# Patient Record
Sex: Male | Born: 1995 | Race: White | Hispanic: No | Marital: Single | Smoking: Never smoker
Health system: Southern US, Community
[De-identification: ages and names within clinical notes are randomized; demographics above are authoritative.]

## PROBLEM LIST (undated history)

## (undated) HISTORY — PX: OTHER SURGICAL HISTORY: SHX169

## (undated) HISTORY — PX: APPENDECTOMY: SHX54

---
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
## 2017-11-26 HISTORY — PX: PECTUS EXCAVATUM REPAIR: SHX437

## 2018-09-16 ENCOUNTER — Other Ambulatory Visit: Payer: Self-pay | Admitting: Cardiothoracic Surgery

## 2018-09-16 ENCOUNTER — Other Ambulatory Visit: Payer: Self-pay

## 2018-09-16 DIAGNOSIS — Q676 Pectus excavatum: Secondary | ICD-10-CM

## 2018-09-17 ENCOUNTER — Encounter: Payer: Self-pay | Admitting: *Deleted

## 2018-09-17 ENCOUNTER — Other Ambulatory Visit: Payer: Self-pay | Admitting: *Deleted

## 2018-09-17 ENCOUNTER — Encounter: Payer: Self-pay | Admitting: Cardiothoracic Surgery

## 2018-09-17 ENCOUNTER — Ambulatory Visit
Admission: RE | Admit: 2018-09-17 | Discharge: 2018-09-17 | Disposition: A | Discharge status: Home / Self Care | Payer: 59 | Source: Ambulatory Visit | Attending: Cardiothoracic Surgery | Admitting: Cardiothoracic Surgery

## 2018-09-17 ENCOUNTER — Institutional Professional Consult (permissible substitution) (INDEPENDENT_AMBULATORY_CARE_PROVIDER_SITE_OTHER): Payer: 59 | Admitting: Cardiothoracic Surgery

## 2018-09-17 VITALS — BP 119/77 | HR 81 | Temp 99.0°F | Resp 16 | Ht 70.0 in | Wt 163.0 lb

## 2018-09-17 DIAGNOSIS — Q676 Pectus excavatum: Secondary | ICD-10-CM

## 2018-09-22 ENCOUNTER — Encounter: Payer: Self-pay | Admitting: Cardiothoracic Surgery

## 2018-09-22 NOTE — Progress Notes (Signed)
[address]       [place] [id number]             [phone]                    Darren KassPatrick T Hollenback [facility] Medical Record #[id number] Date of Birth: 03/04/1996  Referring: self Primary Care: Patient, No Pcp Per Primary Cardiologist: No primary care provider on file.  Chief Complaint:    Chief Complaint  Patient presents with  . Routine Post Op    to assess previous REPAIR  of PECTUS EX[place]UM [place]    History of Present Illness:    Darren Phillips 23 y.o. male is seen in the office  today at the request of the patient and his father.  The patient is a 23 year old male gives a history of a open pectus repair modified Ravitch type on November 26, 2017 at [facility] in [place] on Dr. Marca Ancona[place]more.  The patient notes that he developed a wound infection particularly along the left lower side of the repair.  He notes he did local dressing changes for 5 months before it finally healed.  He denies any current fever or chills the wound has remained intact without any erythema or drainage.  The patient complains of chronic pain along the incision.  He is also concerned that his heart is being squeezed.    Current Activity/ Functional Status:  Patient is independent with mobility/ambulation, transfers, ADL's, IADL's.   Zubrod Score: At the time of surgery this patient's most appropriate activity status/level should be described as: []     0    Normal activity, no symptoms []     1    Restricted in physical strenuous activity but ambulatory, able to do out light work []     2    Ambulatory and capable of self care, unable to do work activities, up and about               >50 % of waking hours                              []     3    Only limited self care, in bed greater than 50% of waking hours []     4    Completely disabled, no self care, confined to bed or chair []     5    Moribund   History reviewed. No pertinent past medical  history.  Past Surgical History:  Procedure Laterality Date  . APPENDECTOMY    . PECTUS EXCAVATUM REPAIR  11/26/2017   Dr. Hoyle Barrennis [place]...  Marland Kitchen. post op wound     s/p REPAIR OF PECTUS EXCAVATUM   11/08/17    History reviewed. No pertinent family history.   Social History   Tobacco Use  Smoking Status Never Smoker  Smokeless Tobacco Never Used    Social History   Substance and Sexual Activity  Alcohol Use Not on file   Comment: occasionally       No current outpatient medications on file.   No current facility-administered medications for this visit.     Pertinent items are noted in HPI.   Review of Systems:     Cardiac Review of Systems: [Y] = yes  or   [ N ] = no   Chest Pain [ y   ]  Resting SOB [   ]  Exertional SOB  [ y ]  Myer Peer  ]   Pedal Edema [ n ]    Palpitations [ n ] Syncope  [n  ]   Presyncope [ dizzyness  ]   General Review of Systems: [Y] = yes [  ]=no Constitional: recent weight change [  ];  Wt loss over the last 3 months [   ] anorexia [  ]; fatigue [  ]; nausea [  ]; night sweats [  ]; fever [  ]; or chills [  ];           Eye : blurred vision [  ]; diplopia [   ]; vision changes [  ];  Amaurosis fugax[  ]; Resp: cough [  ];  wheezing[  ];  hemoptysis[  ]; shortness of breath[  ]; paroxysmal nocturnal dyspnea[  ]; dyspnea on exertion[  ]; or orthopnea[  ];  GI:  gallstones[  ], vomiting[  ];  dysphagia[  ]; melena[  ];  hematochezia [  ]; heartburn[  ];   Hx of  Colonoscopy[  ]; GU: kidney stones [  ]; hematuria[  ];   dysuria [  ];  nocturia[  ];  history of     obstruction [  ]; urinary frequency [  ]             Skin: rash, swelling[  ];, hair loss[  ];  peripheral edema[  ];  or itching[  ]; Musculosketetal: myalgias[  ];  joint swelling[  ];  joint erythema[  ];  joint pain[  ];  back pain[  ];  Heme/Lymph: bruising[  ];  bleeding[  ];  anemia[  ];  Neuro: TIA[  ];  headaches[  ];  stroke[  ];  vertigo[  ];  seizures[  ];   paresthesias[   ];  difficulty walking[  ];  Psych:depression[  ]; anxiety[  ];  Endocrine: diabetes[  ];  thyroid dysfunction[  ];  Immunizations: Flu up to date [  ]; Pneumococcal up to date [  ];  Other:     PHYSICAL EXAMINATION: BP 119/77 (BP Location: Right Arm, Patient Position: Sitting, Cuff Size: Normal)   Pulse 81   Temp 99 F (37.2 C) (Oral)   Resp 16   Ht 5\' 10"  (1.778 m)   Wt 163 lb (73.9 kg)   SpO2 97%   BMI 23.39 kg/m  General appearance: alert, cooperative and appears stated age Head: Normocephalic, without obvious abnormality, atraumatic Neck: no adenopathy, no carotid bruit, no JVD, supple, symmetrical, trachea midline and thyroid not enlarged, symmetric, no tenderness/mass/nodules Resp: clear to auscultation bilaterally Cardio: regular rate and rhythm, S1, S2 normal, no murmur, click, rub or gallop GI: soft, non-tender; bowel sounds normal; no masses,  no organomegaly Extremities: extremities normal, atraumatic, no cyanosis or edema Neurologic: Grossly normal Incision: Incision is healed without evidence of drainage, the left side the incision is milder the patient notes this is the area that had broken down and that he packed previously.  The degree of deformity is mild, when measured there is approximately 1.5 cm depression in the center compared to the high points on each side.        Diagnostic Studies & Laboratory data:     Recent Radiology Findings:   Dg Chest 2 View  Result Date: 09/17/2018 CLINICAL DATA:  Chest pain, shortness of breath, pectus deformity EXAM: CHEST - 2 VIEW COMPARISON:  None. FINDINGS: Anterior plate screw  fixation of the midline sternum. Residual pectus deformity on the lateral view. Normal heart size and vascularity. Lungs remain clear. No effusion or pneumothorax. Trachea midline. No acute osseous finding. IMPRESSION: Postop changes as above.  No acute finding by plain radiography Electronically Signed   By: Judie Petit.  Shick M.D.   On: 09/17/2018 15:13      I have independently reviewed the above radiology studies  and reviewed the findings with the patient.   Recent Lab Findings: No results found for: WBC, HGB, HCT, PLT, GLUCOSE, CHOL, TRIG, HDL, LDLDIRECT, LDLCALC, ALT, AST, NA, K, CL, CREATININE, BUN, CO2, TSH, INR, GLUF, HGBA1C    Assessment / Plan:    1 postop modified Ravitch repair of pectus previous Haller index 3.0, complicated by postoperative wound infection healed by packing and secondary granulation over a 85-month.  Now with chronic pain in the area.  On chest x-ray there is no evidence of displaced rib plate or screws.  I discussed with the patient and his father(who was on the phone during the visit) proceeding with CT scan of the chest to evaluate the repair and also to look for any evidence of possible osteomyelitis in the bone.  I will plan to see the patient back after the CT scan has been completed.     I  spent 30 minutes with  the patient face to face and greater then 50% of the time was spent in counseling and coordination of care.    Delight Ovens MD      [address] [place] [place] [address] Office [phone]   Beeper [phone]  09/22/2018 11:55 AM

## 2018-09-25 ENCOUNTER — Ambulatory Visit
Admission: RE | Admit: 2018-09-25 | Discharge: 2018-09-25 | Disposition: A | Discharge status: Home / Self Care | Payer: 59 | Source: Ambulatory Visit | Attending: Cardiothoracic Surgery | Admitting: Cardiothoracic Surgery

## 2018-09-25 DIAGNOSIS — Q676 Pectus excavatum: Secondary | ICD-10-CM

## 2018-10-01 ENCOUNTER — Other Ambulatory Visit: Payer: Self-pay

## 2018-10-01 ENCOUNTER — Ambulatory Visit (INDEPENDENT_AMBULATORY_CARE_PROVIDER_SITE_OTHER): Payer: 59 | Admitting: Cardiothoracic Surgery

## 2018-10-01 ENCOUNTER — Encounter: Payer: Self-pay | Admitting: Cardiothoracic Surgery

## 2018-10-01 VITALS — BP 127/82 | HR 102 | Temp 97.9°F | Resp 16 | Ht 70.0 in | Wt 160.0 lb

## 2018-10-01 DIAGNOSIS — Q676 Pectus excavatum: Secondary | ICD-10-CM

## 2018-10-01 NOTE — Progress Notes (Signed)
[address]       [place] [address]             [phone]                    Julienne Kass [facility] Medical Record #[id number] Date of Birth: Oct 22, 1995  Referring: self Primary Care: Patient, No Pcp Per Primary Cardiologist: No primary care provider on file.  Chief Complaint:    Chief Complaint  Patient presents with   Follow-up    f/u after Chest CT 09/25/18, HX of open pectus repair  11/26/17 @ [facility] , [place]    History of Present Illness:    Darren Phillips 23 y.o. male is seen in the office last week at the request of the patient and his father.  The patient is a 23 year old male gives a history of a open pectus repair modified Ravitch type on November 26, 2017 at [facility] in [place] on Dr. Marca Ancona.  The patient notes that he developed a wound infection particularly along the left lower side of the repair.  He notes he did local dressing changes for 5 months before it finally healed.  He denies any current fever or chills the wound has remained intact without any erythema or drainage.  The patient complains of chronic pain along the incision.  He is also concerned that his heart is being squeezed.   HE RETURNS TODAY WITH FOLLOW CT SCAN TO EVALUATE REPAIR AND LOOK FOR ANY EVIDENCE OF OSTEO   Current Activity/ Functional Status:  Patient is independent with mobility/ambulation, transfers, ADL's, IADL's.   Zubrod Score: At the time of surgery this patients most appropriate activity status/level should be described as: []     0    Normal activity, no symptoms [x]     1    Restricted in physical strenuous activity but ambulatory, able to do out light work []     2    Ambulatory and capable of self care, unable to do work activities, up and about               >50 % of waking hours                              []     3    Only limited self care, in bed greater than 50% of waking hours []     4    Completely  disabled, no self care, confined to bed or chair []     5    Moribund   No past medical history on file.  Past Surgical History:  Procedure Laterality Date   APPENDECTOMY     PECTUS EXCAVATUM REPAIR  11/26/2017   Dr. Hoyle Barr...   post op wound     s/p REPAIR OF PECTUS EXCAVATUM   11/08/17    No family history on file.   Social History   Tobacco Use  Smoking Status Never Smoker  Smokeless Tobacco Never Used    Social History   Substance and Sexual Activity  Alcohol Use Not on file   Comment: occasionally       No current outpatient medications on file.   No current facility-administered medications for this visit.     Pertinent items are noted in HPI.   Review of Systems:     Cardiac Review of Systems: [Y] = yes  or   [  N ] = no   Chest Pain [ y   ]  Resting SOB [   ] Exertional SOB  [ y ]  Orthopnea Milo.Brash[n  ]   Pedal Edema [ n ]    Palpitations [ n ] Syncope  [n  ]   Presyncope [ dizzyness  ]   General Review of Systems: [Y] = yes [  ]=no Constitional: recent weight change [  ];  Wt loss over the last 3 months [   ] anorexia [  ]; fatigue [  ]; nausea [  ]; night sweats [  ]; fever [  ]; or chills [  ];           Eye : blurred vision [  ]; diplopia [   ]; vision changes [  ];  Amaurosis fugax[  ]; Resp: cough [  ];  wheezing[  ];  hemoptysis[  ]; shortness of breath[  ]; paroxysmal nocturnal dyspnea[  ]; dyspnea on exertion[  ]; or orthopnea[  ];  GI:  gallstones[  ], vomiting[  ];  dysphagia[  ]; melena[  ];  hematochezia [  ]; heartburn[  ];   Hx of  Colonoscopy[  ]; GU: kidney stones [  ]; hematuria[  ];   dysuria [  ];  nocturia[  ];  history of     obstruction [  ]; urinary frequency [  ]             Skin: rash, swelling[  ];, hair loss[  ];  peripheral edema[  ];  or itching[  ]; Musculosketetal: myalgias[  ];  joint swelling[  ];  joint erythema[  ];  joint pain[  ];  back pain[  ];  Heme/Lymph: bruising[  ];  bleeding[  ];  anemia[  ];  Neuro: TIA[   ];  headaches[  ];  stroke[  ];  vertigo[  ];  seizures[  ];   paresthesias[  ];  difficulty walking[  ];  Psych:depression[  ]; anxiety[  ];  Endocrine: diabetes[  ];  thyroid dysfunction[  ];  Immunizations: Flu up to date [  ]; Pneumococcal up to date [  ];  Other:     PHYSICAL EXAMINATION: BP 127/82 (BP Location: Right Arm, Patient Position: Sitting, Cuff Size: Normal)    Pulse (!) 102    Temp 97.9 F (36.6 C) (Skin)    Resp 16    Ht 5\' 10"  (1.778 m)    Wt 160 lb (72.6 kg)    SpO2 98% Comment: RA   BMI 22.96 kg/m  General appearance: alert and cooperative Head: Normocephalic, without obvious abnormality, atraumatic Resp: clear to auscultation bilaterally Cardio: regular rate and rhythm, S1, S2 normal, no murmur, click, rub or gallop GI: soft, non-tender; bowel sounds normal; no masses,  no organomegaly Extremities: extremities normal, atraumatic, no cyanosis or edema and Homans sign is negative, no sign of DVT Neurologic: Grossly normal Incision: Incision is healed without evidence of drainage, the left side the incision is milder the patient notes this is the area that had broken down and that he packed previously.  The degree of deformity is mild, when measured there is approximately 1.5 cm depression in the center compared to the high points on each side.        Diagnostic Studies & Laboratory data:     Recent Radiology Findings:   Dg Chest 2 View  Result Date: 09/17/2018 CLINICAL DATA:  Chest pain, shortness  of breath, pectus deformity EXAM: CHEST - 2 VIEW COMPARISON:  None. FINDINGS: Anterior plate screw fixation of the midline sternum. Residual pectus deformity on the lateral view. Normal heart size and vascularity. Lungs remain clear. No effusion or pneumothorax. Trachea midline. No acute osseous finding. IMPRESSION: Postop changes as above.  No acute finding by plain radiography Electronically Signed   By: Judie Petit.  Shick M.D.   On: 09/17/2018 15:13   Ct Chest Wo  Contrast  Result Date: 09/25/2018 CLINICAL DATA:  History of pectus excavatum repair in July 2019 with subsequent wound infection at incision. Pain at incision. EXAM: CT CHEST WITHOUT CONTRAST TECHNIQUE: Multidetector CT imaging of the chest was performed following the standard protocol without IV contrast. COMPARISON:  Chest radiographs 09/17/2018. FINDINGS: Cardiovascular: No significant vascular findings on noncontrast imaging. The heart size is normal. There is no pericardial effusion. Mediastinum/Nodes: There are no enlarged mediastinal, hilar or axillary lymph nodes.A small amount of residual thymic tissue is present in the anterior mediastinum. The thyroid gland, trachea and esophagus demonstrate no significant findings. Lungs/Pleura: There is no pleural effusion or pneumothorax. The lungs are clear. Upper abdomen:  The visualized upper abdomen appears unremarkable. Musculoskeletal/Chest wall: There are postsurgical changes in the sternal body with an anterior plate and screws. There is no hardware displacement or loosening. The sternomanubrial junction and sternoclavicular joints appear normal. There is a persistent moderate pectus excavatum deformity with resulting mild mass effect on the heart. There is no parasternal inflammatory change or fluid collection. There is mild linear dermal/subcutaneous thickening along the presumed incision which could reflect keloid formation. Mild bilateral gynecomastia noted. IMPRESSION: 1. No demonstrated complication from previous repair of pectus excavatum deformity. There is no hardware displacement. 2. Apparent linear dermal/subcutaneous thickening along the incision. No focal fluid collection. 3. Residual pectus excavatum deformity with mild mass effect on the heart. The mediastinum and lungs otherwise appear unremarkable. Electronically Signed   By: Carey Bullocks M.D.   On: 09/25/2018 17:07   Although not reported my estimate of the Haller index is 2.8   I  have independently reviewed the above radiology studies  and reviewed the findings with the patient.   Recent Lab Findings: No results found for: WBC, HGB, HCT, PLT, GLUCOSE, CHOL, TRIG, HDL, LDLDIRECT, LDLCALC, ALT, AST, NA, K, CL, CREATININE, BUN, CO2, TSH, INR, GLUF, HGBA1C    Assessment / Plan:    1 postop modified Ravitch repair of pectus previous Haller index 3.0, complicated by postoperative wound infection healed by packing and secondary granulation over a 61-month.  Now with chronic pain in the area.  On chest x-ray there is no evidence of displaced rib plate or screws.  I discussed with the patient and his father(who was on the phone during the visit) proceeding with CT scan of the chest to evaluate the repair and also to look for any evidence of possible osteomyelitis in the bone.  CT scan shows no changes in the hardware or evidence of osteomyelitis that would cause postoperative pain.  I have discussed urged any use of narcotics, and made it clear to the patient I would not be prescribing narcotics.  I did offer the referral to pain clinic, the patient will think about this and let us know.  At This point I see no indication for surgical intervention.      Delight Ovens MD      [address] [place] [place] [address] Office [phone]   Beeper [phone]  10/01/2018 10:43 AM

## 2020-04-20 IMAGING — CR CHEST - 2 VIEW
2 series · 2 of 2 positions shown · non-contrast
Comparison: None.

CLINICAL DATA: Chest pain, shortness of breath, pectus deformity

EXAM:
CHEST - 2 VIEW

[w chest pa]
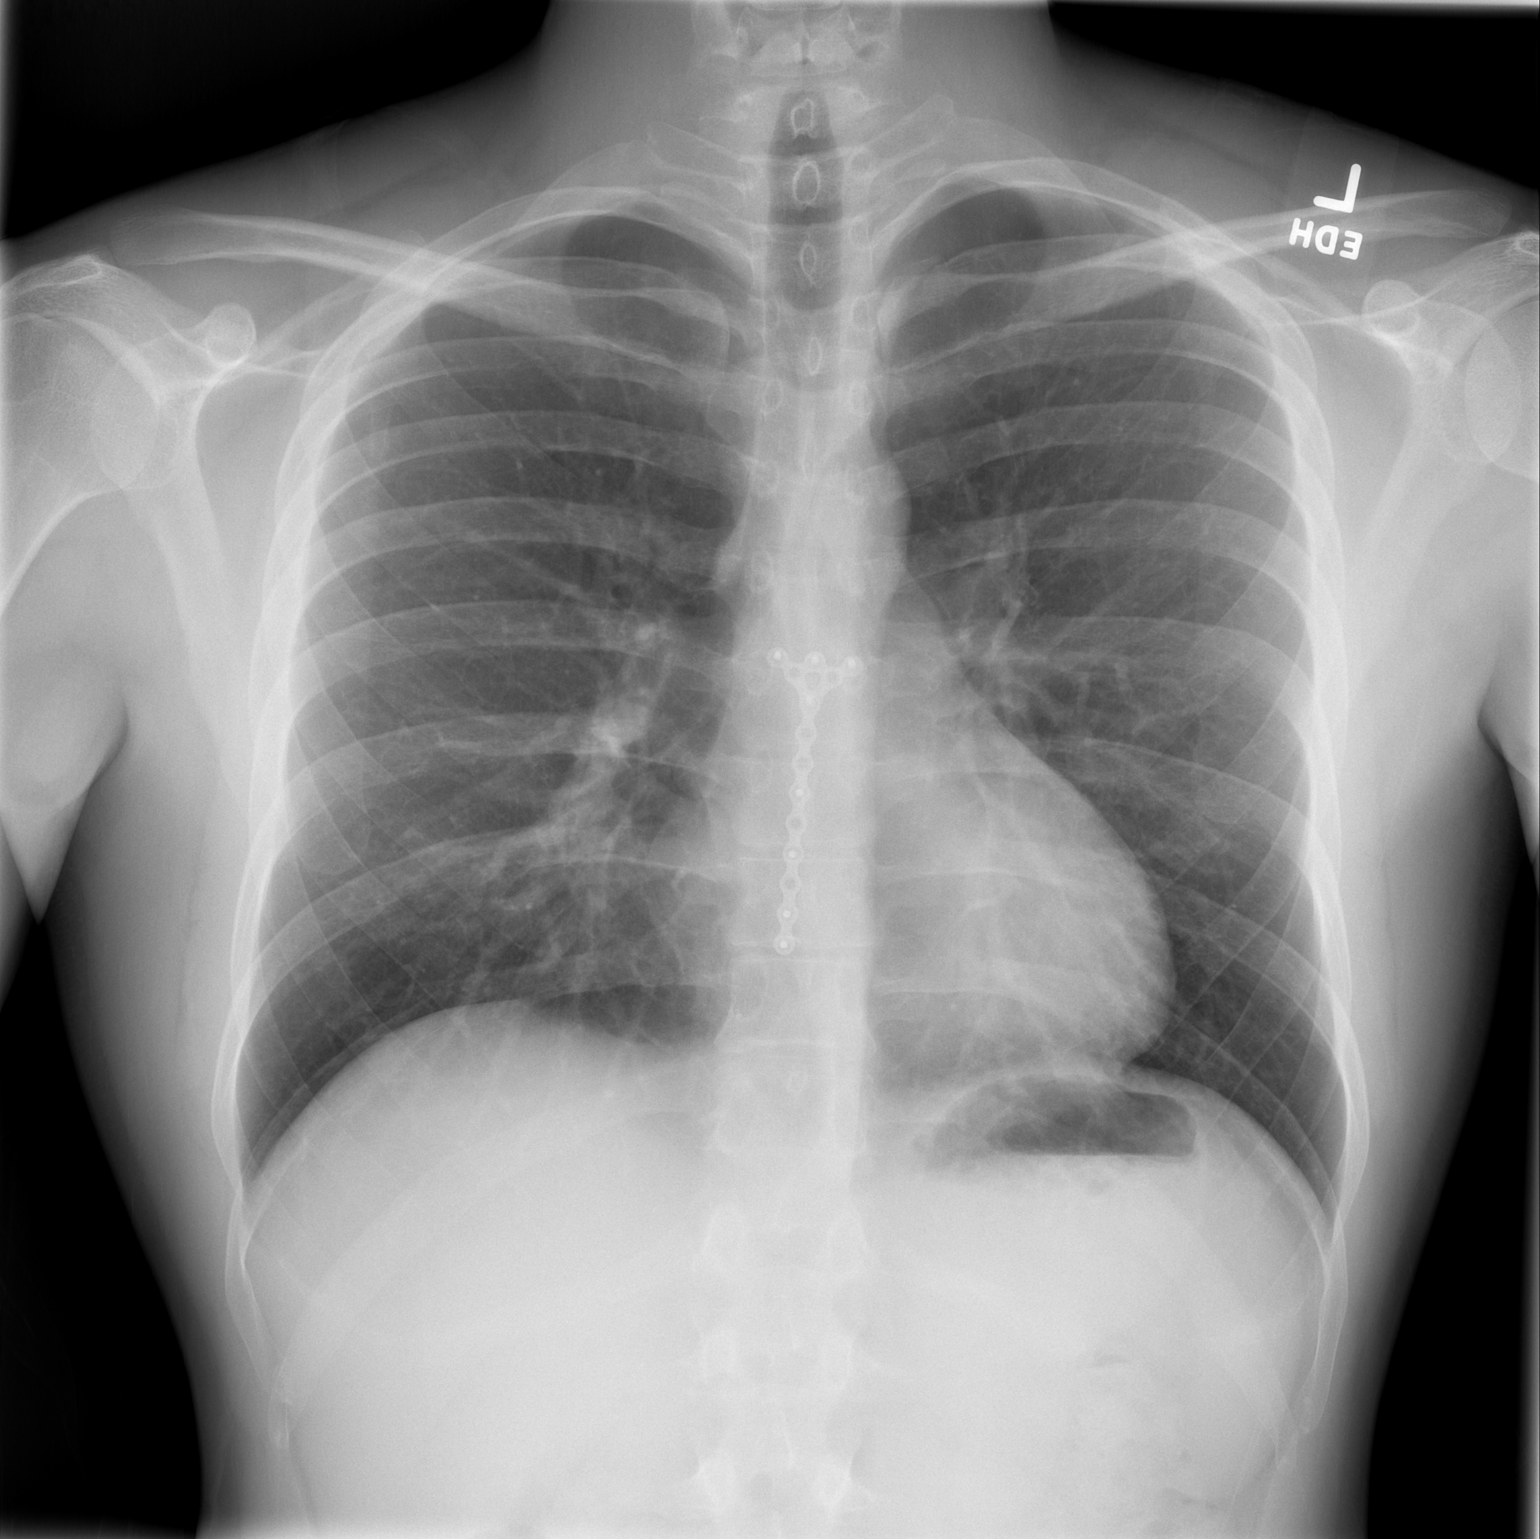

[w chest lat]
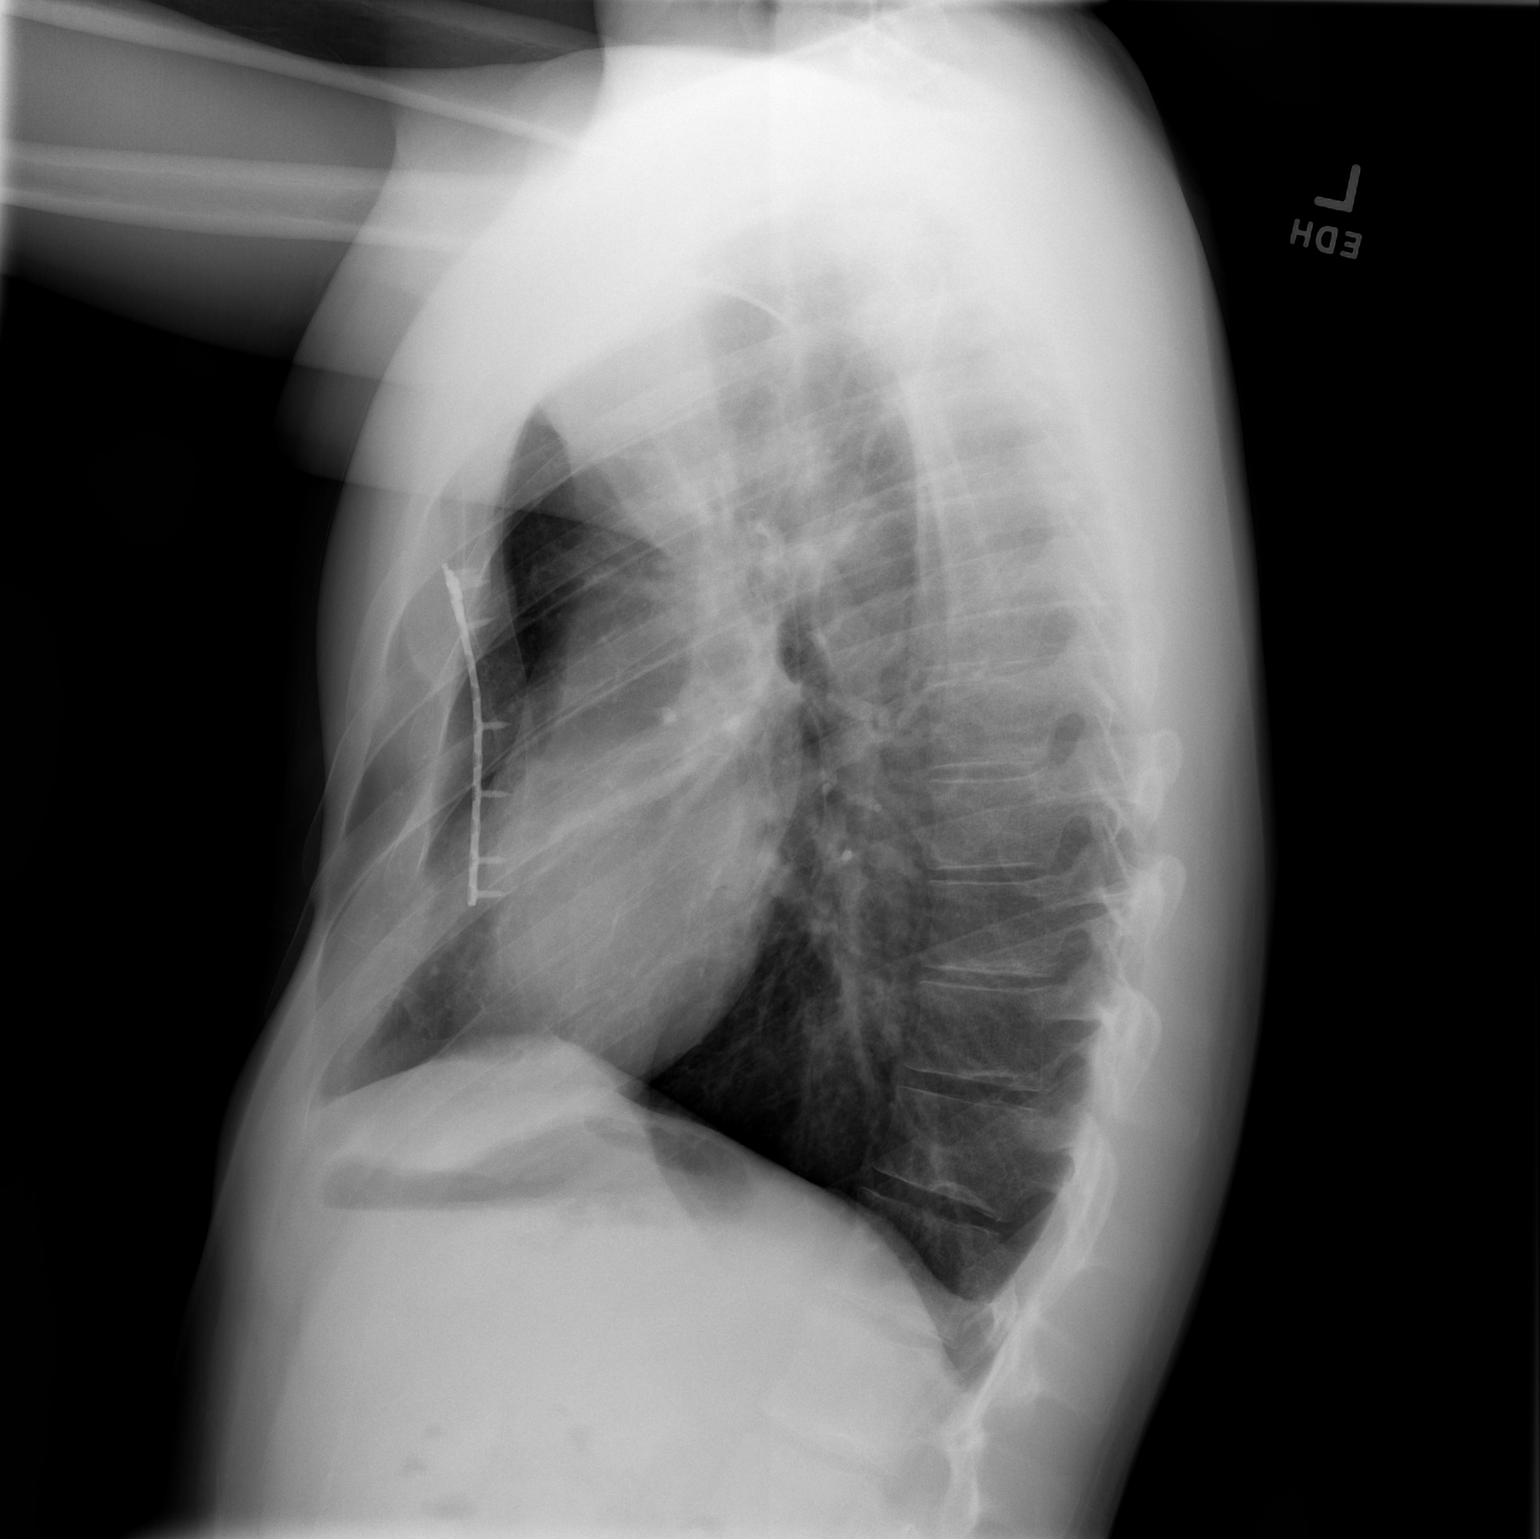

[2 of 2 positions shown; findings below may reference images not displayed]

FINDINGS: Anterior plate screw fixation of the midline sternum. Residual
pectus deformity on the lateral view. Normal heart size and
vascularity. Lungs remain clear. No effusion or pneumothorax.
Trachea midline. No acute osseous finding.
IMPRESSION: Postop changes as above.  No acute finding by plain radiography

## 2020-04-28 IMAGING — CT CT CHEST WITHOUT CONTRAST
3 of 5 series · 12 of 36 positions shown, 14 images · non-contrast
Comparison: Chest radiographs 09/17/2018.

CLINICAL DATA: History of pectus excavatum repair in October 2017 with
subsequent wound infection at incision. Pain at incision.

EXAM:
CT CHEST WITHOUT CONTRAST
TECHNIQUE: Multidetector CT imaging of the chest was performed following the
standard protocol without IV contrast.

[Series 2: chest 2.00 br40 s3 ax · axial · 0.52mm/px · z∈[+1335,+1609]mm · 8 of 177 slices shown, 10 images]
[im 20/177  mediastinal]
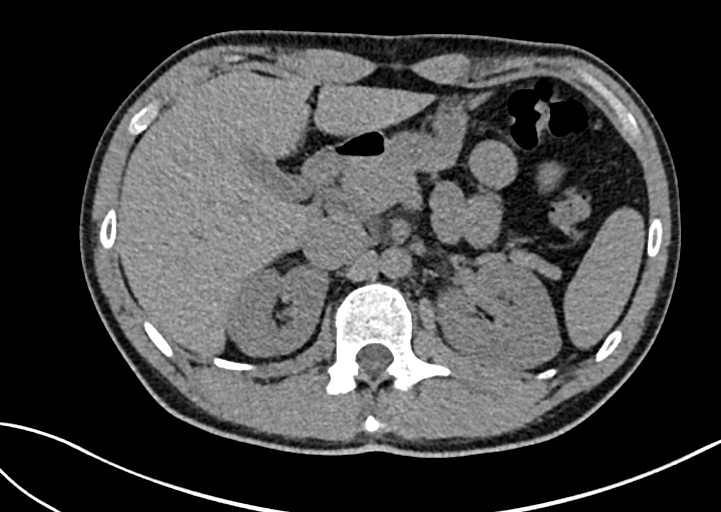
[im 20/177  lung]
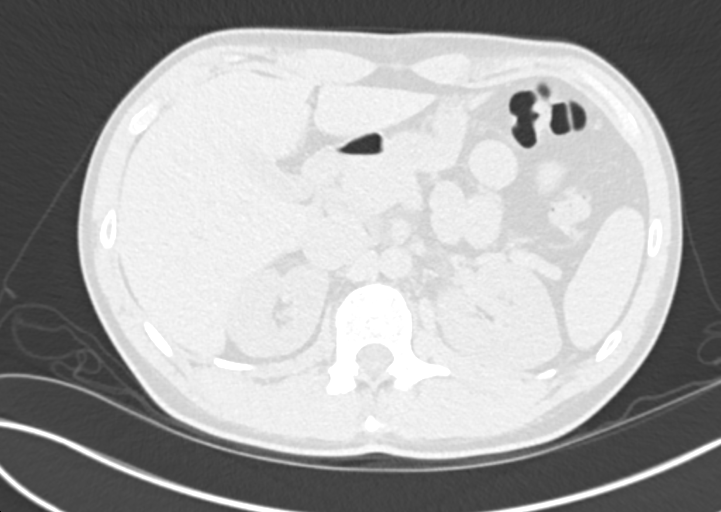
[im 40/177  lung]
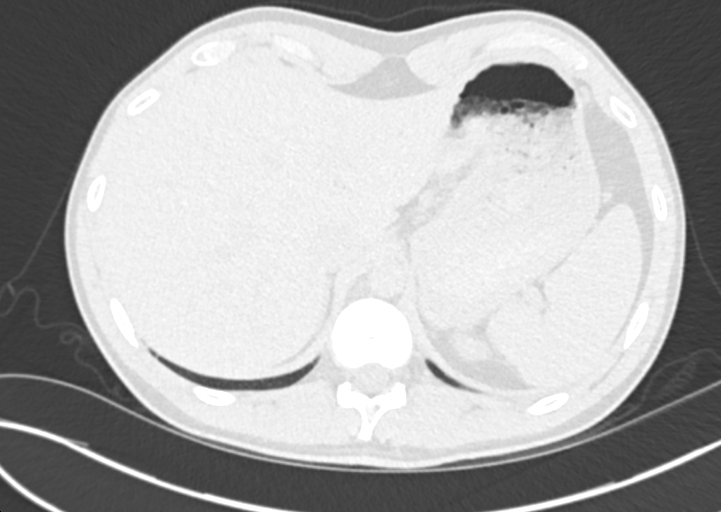
[im 59/177  lung]
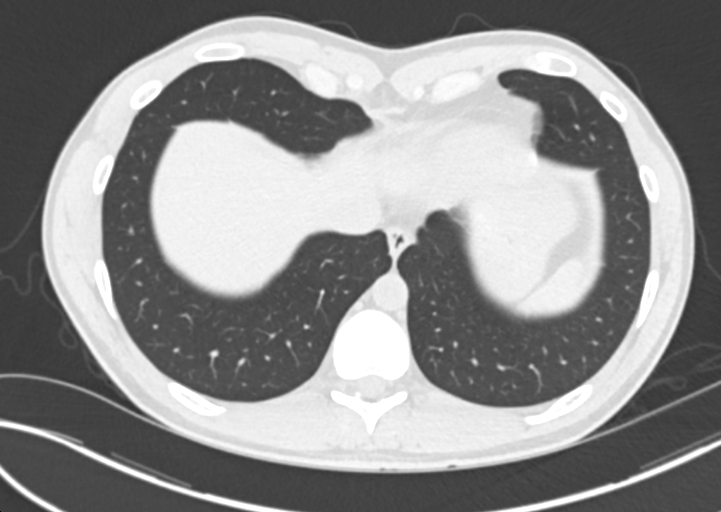
[im 79/177  lung]
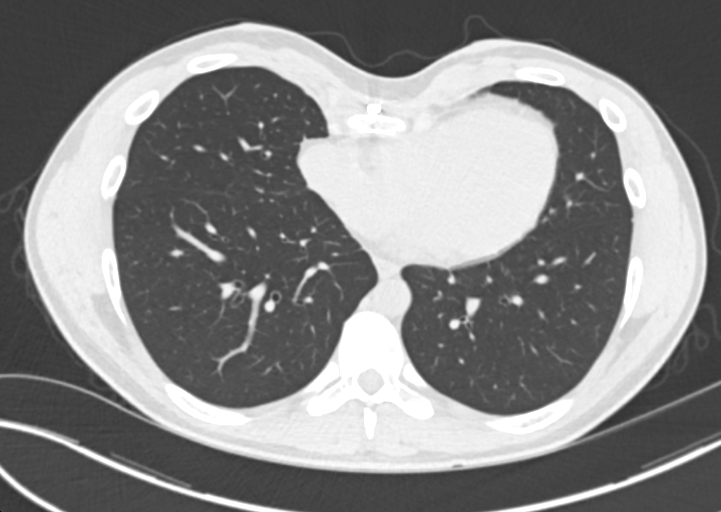
[im 98/177  mediastinal]
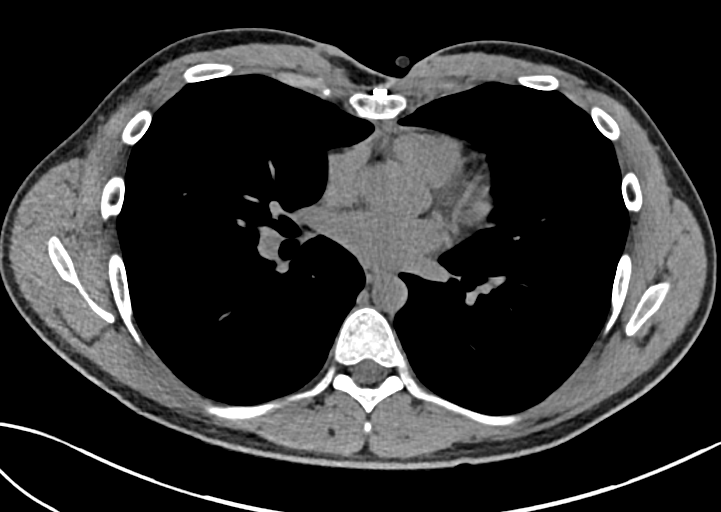
[im 98/177  lung]
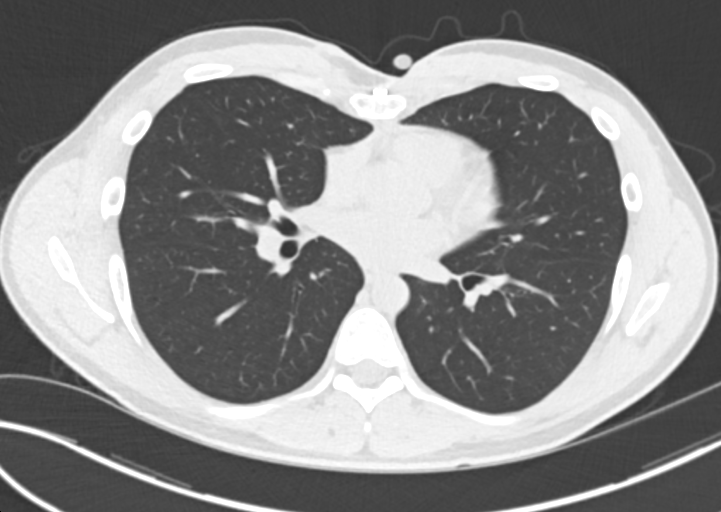
[im 118/177  lung]
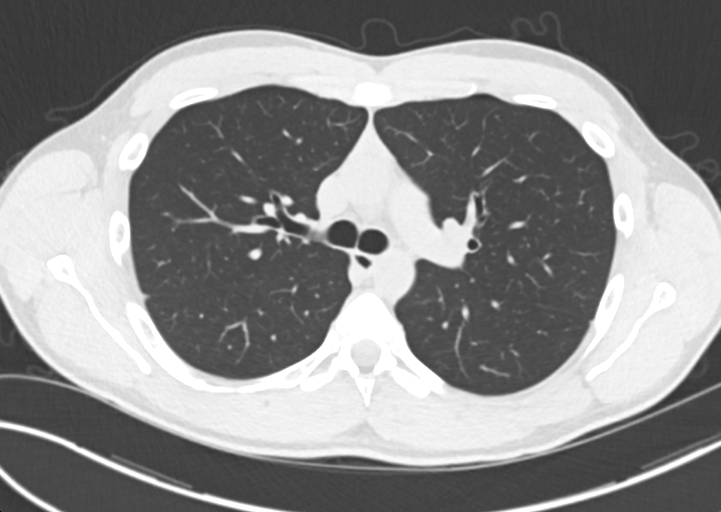
[im 137/177  lung]
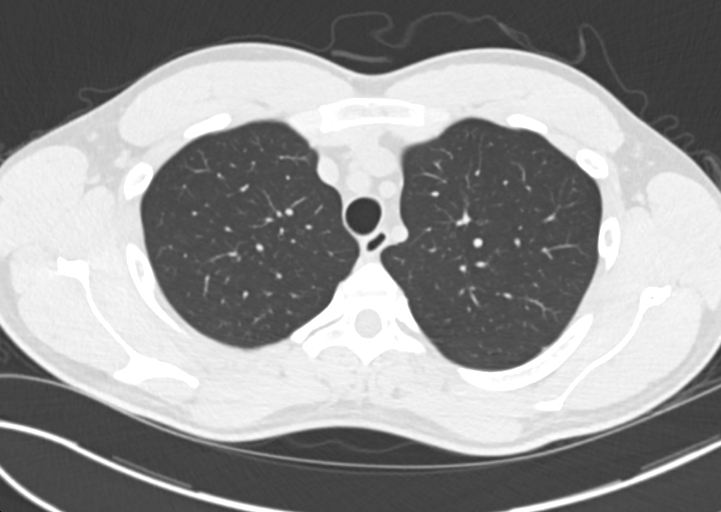
[im 157/177  lung]
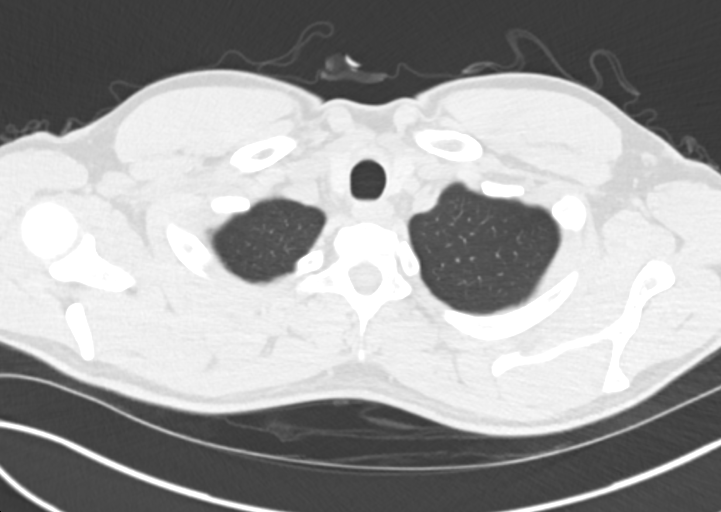

[Series 4: chest 2.00 br40 s3 cor · coronal · 0.69mm/px · 3 of 133 slices shown]
[im 27/133  lung]
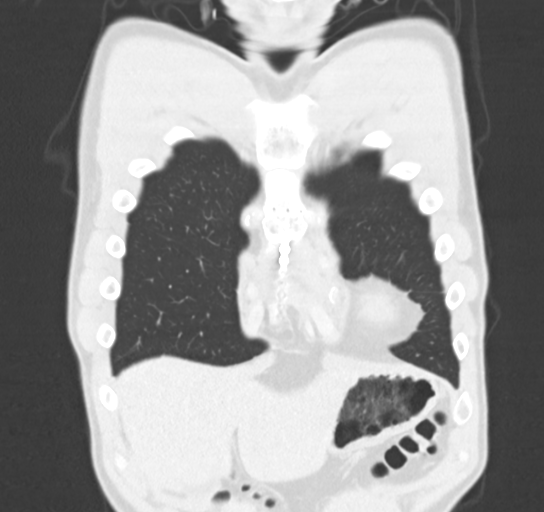
[im 53/133  lung]
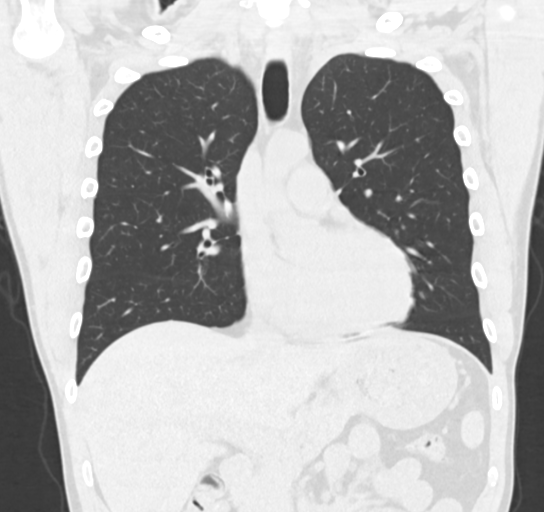
[im 80/133  lung]
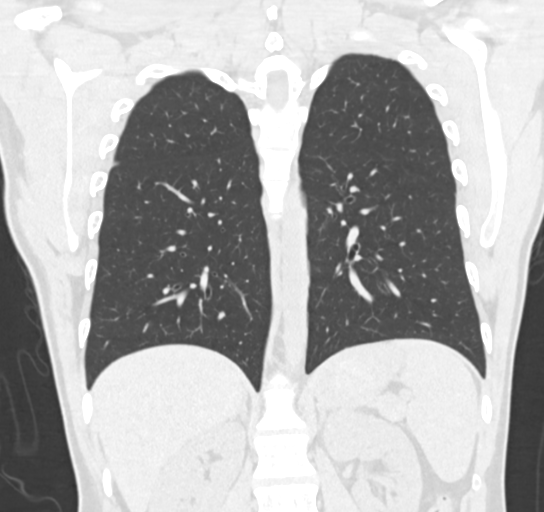

[Series 8: chest 2.00 br60 s3 ax · axial · 0.52mm/px · 1 of 174 slices shown]
[im 22/174  lung]
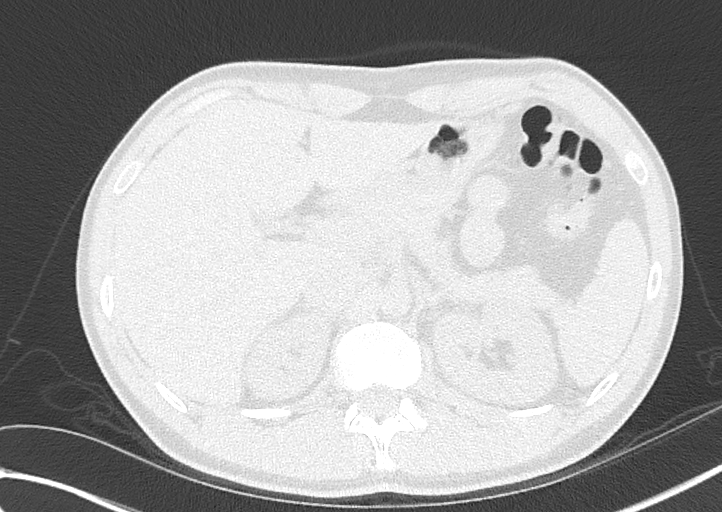

[12 of 36 positions shown; findings below may reference images not displayed]

FINDINGS: Cardiovascular: No significant vascular findings on noncontrast
imaging. The heart size is normal. There is no pericardial effusion.

Mediastinum/Nodes: There are no enlarged mediastinal, hilar or
axillary lymph nodes.A small amount of residual thymic tissue is
present in the anterior mediastinum. The thyroid gland, trachea and
esophagus demonstrate no significant findings.

Lungs/Pleura: There is no pleural effusion or pneumothorax. The
lungs are clear.

Upper abdomen:  The visualized upper abdomen appears unremarkable.

Musculoskeletal/Chest wall: There are postsurgical changes in the
sternal body with an anterior plate and screws. There is no hardware
displacement or loosening. The sternomanubrial junction and
sternoclavicular joints appear normal. There is a persistent
moderate pectus excavatum deformity with resulting mild mass effect
on the heart. There is no parasternal inflammatory change or fluid
collection. There is mild linear dermal/subcutaneous thickening
along the presumed incision which could reflect keloid formation.
Mild bilateral gynecomastia noted.
IMPRESSION: 1. No demonstrated complication from previous repair of pectus
excavatum deformity. There is no hardware displacement.
2. Apparent linear dermal/subcutaneous thickening along the
incision. No focal fluid collection.
3. Residual pectus excavatum deformity with mild mass effect on the
heart. The mediastinum and lungs otherwise appear unremarkable.
